# Patient Record
Sex: Male | Born: 1995 | Race: White | Hispanic: No | Marital: Single | Smoking: Never smoker
Health system: Southern US, Community
[De-identification: ages and names within clinical notes are randomized; demographics above are authoritative.]

---
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
## 2016-05-27 ENCOUNTER — Encounter (HOSPITAL_BASED_OUTPATIENT_CLINIC_OR_DEPARTMENT_OTHER): Payer: Self-pay | Admitting: *Deleted

## 2016-05-27 ENCOUNTER — Emergency Department (HOSPITAL_BASED_OUTPATIENT_CLINIC_OR_DEPARTMENT_OTHER): Payer: Self-pay

## 2016-05-27 ENCOUNTER — Emergency Department (HOSPITAL_BASED_OUTPATIENT_CLINIC_OR_DEPARTMENT_OTHER)
Admission: EM | Admit: 2016-05-27 | Discharge: 2016-05-27 | Disposition: A | Discharge status: Home / Self Care | Payer: Self-pay | Attending: Emergency Medicine | Admitting: Emergency Medicine

## 2016-05-27 DIAGNOSIS — S62366A Nondisplaced fracture of neck of fifth metacarpal bone, right hand, initial encounter for closed fracture: Secondary | ICD-10-CM | POA: Insufficient documentation

## 2016-05-27 DIAGNOSIS — Y939 Activity, unspecified: Secondary | ICD-10-CM | POA: Insufficient documentation

## 2016-05-27 DIAGNOSIS — S62309A Unspecified fracture of unspecified metacarpal bone, initial encounter for closed fracture: Secondary | ICD-10-CM

## 2016-05-27 DIAGNOSIS — Y999 Unspecified external cause status: Secondary | ICD-10-CM | POA: Insufficient documentation

## 2016-05-27 DIAGNOSIS — W2201XA Walked into wall, initial encounter: Secondary | ICD-10-CM | POA: Insufficient documentation

## 2016-05-27 DIAGNOSIS — Y929 Unspecified place or not applicable: Secondary | ICD-10-CM | POA: Insufficient documentation

## 2016-05-27 NOTE — ED Provider Notes (Signed)
[facility] Provider Note   CSN: [id number] Arrival date & time: 05/27/16  1455  First Provider Contact:  None    By signing my name below, I, Majel Homer, attest that this documentation has been prepared under the direction and in the presence of Rolland Porter, MD . Electronically Signed: Majel Homer, Scribe. 05/27/2016. 4:18 PM.  History   Chief Complaint Chief Complaint  Patient presents with  . Hand Injury   The history is provided by the patient. No language interpreter was used.   HPI Comments: Sean Chandler is a 20 y.o. right hand dominant male who presents to the Emergency Department complaining of gradually worsening, right hand pain that began last night. Pt reports he was drinking with his friends last night when he participated in a "who can hit the wall the hardest" contest.   History reviewed. No pertinent past medical history.  There are no active problems to display for this patient.   History reviewed. No pertinent surgical history.  Home Medications    Prior to Admission medications   Not on File    Family History No family history on file.  Social History Social History  Substance Use Topics  . Smoking status: Never Smoker  . Smokeless tobacco: Never Used  . Alcohol use Yes   Allergies   Review of patient's allergies indicates no known allergies.  Review of Systems Review of Systems  Constitutional: Negative for appetite change, chills, diaphoresis, fatigue and fever.  HENT: Negative for mouth sores, sore throat and trouble swallowing.   Eyes: Negative for visual disturbance.  Respiratory: Negative for cough, chest tightness, shortness of breath and wheezing.   Cardiovascular: Negative for chest pain.  Gastrointestinal: Negative for abdominal distention, abdominal pain, diarrhea, nausea and vomiting.  Endocrine: Negative for polydipsia, polyphagia and polyuria.  Genitourinary: Negative for dysuria, frequency and hematuria.    Musculoskeletal: Positive for arthralgias (right hand ). Negative for gait problem.  Skin: Negative for color change, pallor and rash.  Neurological: Negative for dizziness, syncope, light-headedness and headaches.  Hematological: Does not bruise/bleed easily.  Psychiatric/Behavioral: Negative for behavioral problems and confusion.   Physical Exam Updated Vital Signs BP 140/92   Pulse 78   Temp 98.3 F (36.8 C) (Oral)   Resp 18   Ht  (1.803 m)   Wt 145 lb (65.8 kg)   SpO2 98%   BMI 20.22 kg/m   Physical Exam  Constitutional: He is oriented to person, place, and time. He appears well-developed and well-nourished. No distress.  HENT:  Head: Normocephalic.  Eyes: Conjunctivae are normal. Pupils are equal, round, and reactive to light. No scleral icterus.  Neck: Normal range of motion. Neck supple. No thyromegaly present.  Cardiovascular: Normal rate and regular rhythm.  Exam reveals no gallop and no friction rub.   No murmur heard. Pulmonary/Chest: Effort normal and breath sounds normal. No respiratory distress. He has no wheezes. He has no rales.  Abdominal: Soft. Bowel sounds are normal. He exhibits no distension. There is no tenderness. There is no rebound.  Musculoskeletal: Normal range of motion.  Tenderness in minimal soft tissue; swelling at right fifth proximal metacarpal without deformity  Normal NVA exam   Neurological: He is alert and oriented to person, place, and time.  Skin: Skin is warm and dry. No rash noted.  Psychiatric: He has a normal mood and affect. His behavior is normal.   ED Treatments / Results  Labs (all labs ordered are listed, but only abnormal  results are displayed) Labs Reviewed - No data to display  EKG  EKG Interpretation None       Radiology No results found. Procedures Procedures  DIAGNOSTIC STUDIES:  Oxygen Saturation is 98% on RA, normal by my interpretation.    COORDINATION OF CARE:  4:16 PM Discussed treatment plan  with pt at bedside and pt agreed to plan.   4:05 PM X-ray shows fracture, right hand was placed in splint   SPLINT APPLICATION Date/Time: 9:11 AM Authorized by: Claudean Kinds Consent: Verbal consent obtained. Risks and benefits: risks, benefits and alternatives were discussed Consent given by: patient Splint applied by: orthopedic technician Location details: Hand Splint type:  Supplies used: ortho glass Post-procedure: The splinted body part was neurovascularly unchanged following the procedure. Patient tolerance: Patient tolerated the procedure well with no immediate complications.       Medications Ordered in ED Medications - No data to display  Initial Impression / Assessment and Plan / ED Course  I have reviewed the triage vital signs and the nursing notes.  Pertinent labs & imaging results that were available during my care of the patient were reviewed by me and considered in my medical decision making (see chart for details).  Clinical Course      Final Clinical Impressions(s) / ED Diagnoses   Final diagnoses:  Metacarpal bone fracture, closed, initial encounter    New Prescriptions There are no discharge medications for this patient.  I personally performed the services described in this documentation, which was scribed in my presence. The recorded information has been reviewed and is accurate.     Rolland Porter, MD 06/05/16 [phone]

## 2016-05-27 NOTE — ED Triage Notes (Signed)
He hit a wall with his right fist last night. Pain, bruise and swelling today.

## 2017-03-20 IMAGING — CR DG HAND COMPLETE 3+V*R*
3 series · 3 of 3 positions shown · non-contrast
Comparison: None.

CLINICAL DATA: Patient hit wall with pain

EXAM:
RIGHT HAND - COMPLETE 3+ VIEW

[x hand pa right]
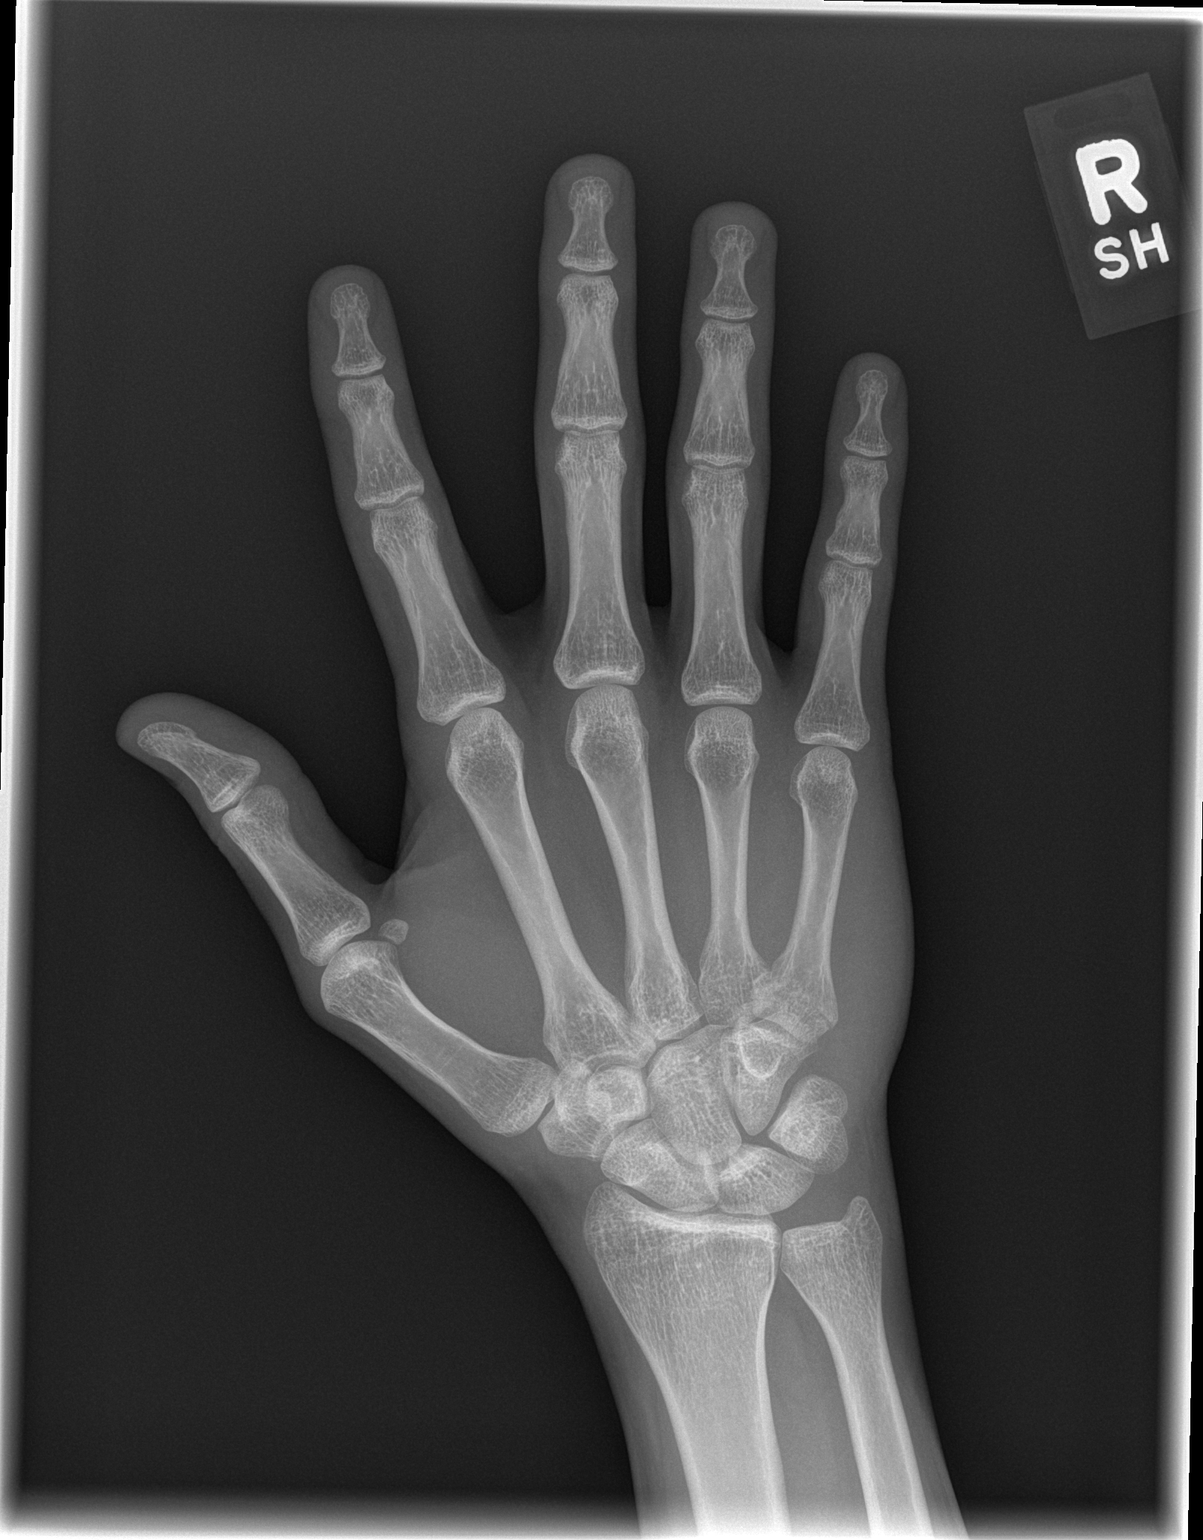

[x hand oblique right]
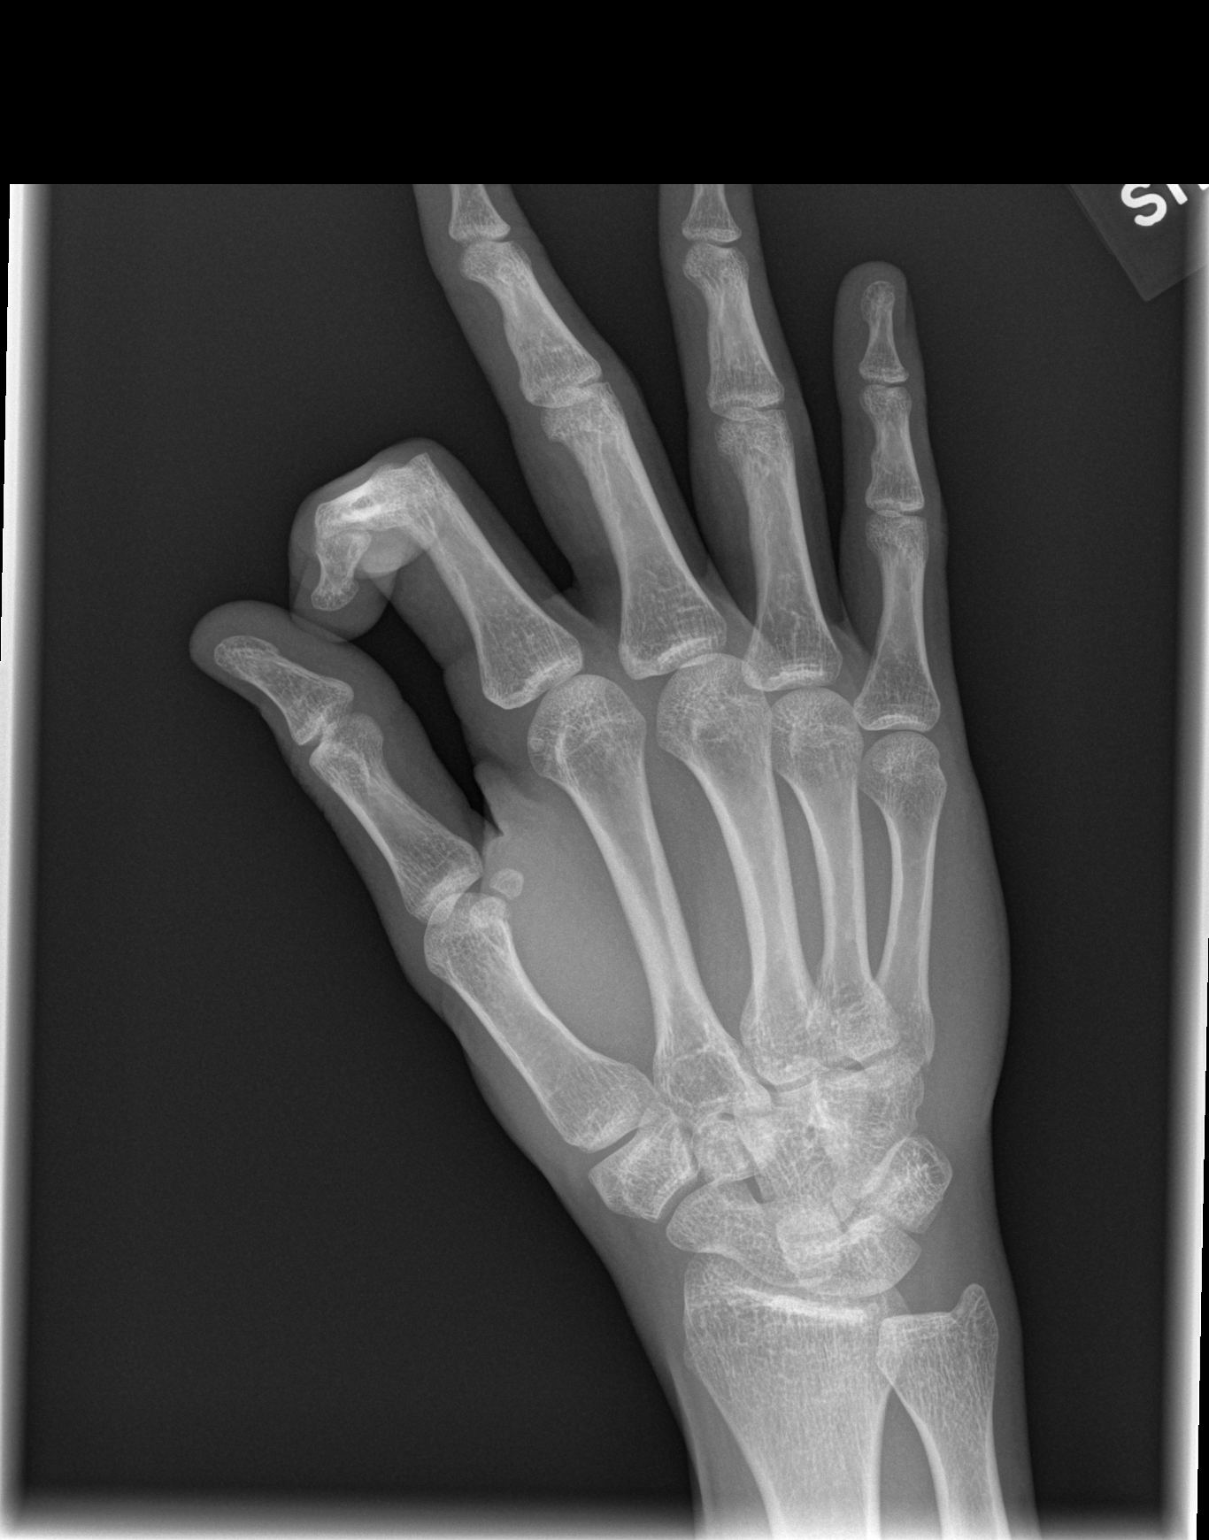

[x hand lat right]
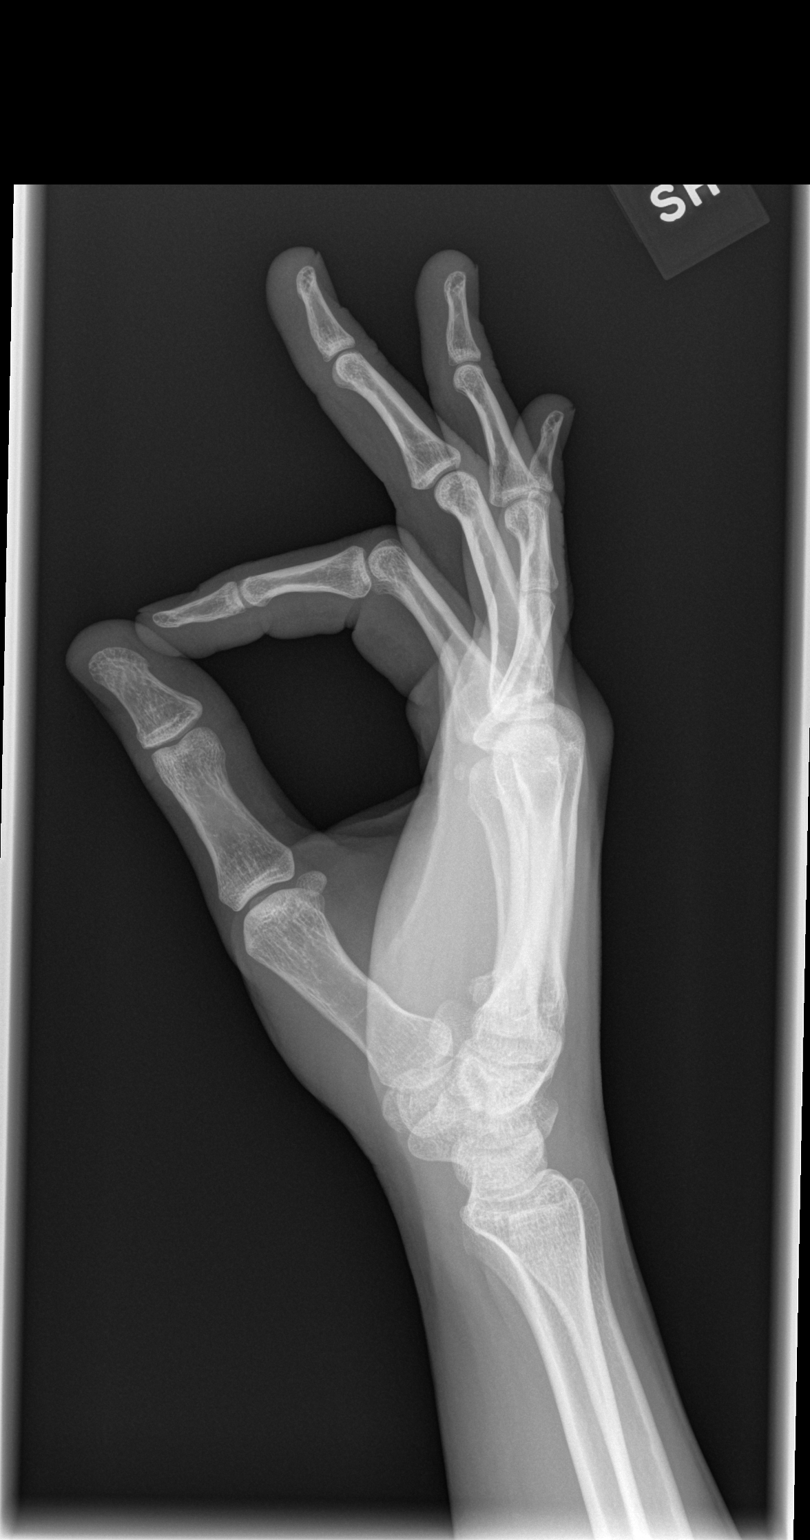

[3 of 3 positions shown; findings below may reference images not displayed]

FINDINGS: Frontal, oblique, and lateral views were obtained. There is a
nondisplaced fracture of the proximal fifth metacarpal. There is
soft tissue swelling in this area medially. No other fracture is
evident. No dislocation. The joint spaces appear normal. No erosive
change.
IMPRESSION: Fracture of the proximal aspect of the fifth metacarpal with
alignment essentially anatomic. No other fracture no dislocation.
Soft tissue swelling medially. No apparent arthropathy.

## 2023-06-21 ENCOUNTER — Emergency Department (HOSPITAL_BASED_OUTPATIENT_CLINIC_OR_DEPARTMENT_OTHER)
Admission: EM | Admit: 2023-06-21 | Discharge: 2023-06-21 | Disposition: A | Discharge status: Home / Self Care | Payer: Self-pay | Attending: Emergency Medicine | Admitting: Emergency Medicine

## 2023-06-21 ENCOUNTER — Other Ambulatory Visit: Payer: Self-pay

## 2023-06-21 ENCOUNTER — Encounter (HOSPITAL_BASED_OUTPATIENT_CLINIC_OR_DEPARTMENT_OTHER): Payer: Self-pay | Admitting: Emergency Medicine

## 2023-06-21 DIAGNOSIS — R070 Pain in throat: Secondary | ICD-10-CM | POA: Insufficient documentation

## 2023-06-21 LAB — CBC WITH DIFFERENTIAL/PLATELET
Abs Immature Granulocytes: 0.03 10*3/uL (ref 0.00–0.07)
Basophils Absolute: 0.1 10*3/uL (ref 0.0–0.1)
Basophils Relative: 1 %
Eosinophils Absolute: 0.1 10*3/uL (ref 0.0–0.5)
Eosinophils Relative: 1 %
HCT: 46.6 % (ref 39.0–52.0)
Hemoglobin: 16.2 g/dL (ref 13.0–17.0)
Immature Granulocytes: 0 %
Lymphocytes Relative: 14 %
Lymphs Abs: 1.4 10*3/uL (ref 0.7–4.0)
MCH: 31 pg (ref 26.0–34.0)
MCHC: 34.8 g/dL (ref 30.0–36.0)
MCV: 89.3 fL (ref 80.0–100.0)
Monocytes Absolute: 0.7 10*3/uL (ref 0.1–1.0)
Monocytes Relative: 7 %
Neutro Abs: 7.9 10*3/uL — ABNORMAL HIGH (ref 1.7–7.7)
Neutrophils Relative %: 77 %
Platelets: 243 10*3/uL (ref 150–400)
RBC: 5.22 MIL/uL (ref 4.22–5.81)
RDW: 12.4 % (ref 11.5–15.5)
WBC: 10.2 10*3/uL (ref 4.0–10.5)
nRBC: 0 % (ref 0.0–0.2)

## 2023-06-21 LAB — COMPREHENSIVE METABOLIC PANEL
ALT: 65 U/L — ABNORMAL HIGH (ref 0–44)
AST: 32 U/L (ref 15–41)
Albumin: 4.9 g/dL (ref 3.5–5.0)
Alkaline Phosphatase: 85 U/L (ref 38–126)
Anion gap: 11 (ref 5–15)
BUN: 12 mg/dL (ref 6–20)
CO2: 22 mmol/L (ref 22–32)
Calcium: 9.4 mg/dL (ref 8.9–10.3)
Chloride: 104 mmol/L (ref 98–111)
Creatinine, Ser: 0.89 mg/dL (ref 0.61–1.24)
GFR, Estimated: >60 mL/min (ref >60)
Glucose, Bld: 92 mg/dL (ref 70–99)
Potassium: 3.8 mmol/L (ref 3.5–5.1)
Sodium: 137 mmol/L (ref 135–145)
Total Bilirubin: 1.2 mg/dL (ref 0.3–1.2)
Total Protein: 7 g/dL (ref 6.5–8.1)

## 2023-06-21 NOTE — Discharge Instructions (Signed)
You came to the emergency department for concerns of a possible parasitic infection with symptoms of "worms crawling in the throat and sinuses."  We took some blood work including a complete blood count and a complete metabolic panel, which did not show any signs of active parasitic infection.   As I discussed with you, if the symptoms you have been experiencing continue to worsen, or if you experience signs of an active infection including fevers, chills, night sweats, vomiting, or diarrhea, you should return to the emergency department.

## 2023-06-21 NOTE — ED Triage Notes (Signed)
Pt was in [place], back about a week. While on cruise, pt ate some fruit (local to the area). Today started with feeling like something crawling in throat and sinuses. Pt did cough something that looked like a parasite.

## 2023-06-21 NOTE — ED Provider Notes (Signed)
I saw and evaluated the patient, reviewed the resident's note and I agree with the findings and plan.   27 year old male presents with sensation of worms in his throat.  Came back from [place] recently and ate some food and thinks may have contracted a parasitic infection.  On exam, his oropharynx is normal.  Will check CBC and likely referral to infectious disease   Lorre Nick, MD 06/21/23 1554

## 2023-06-21 NOTE — ED Provider Notes (Signed)
[place] EMERGENCY DEPARTMENT AT [facility] Provider Note   CSN: [id number] Arrival date & time: 06/21/23  1332     History Feeling like something crawling in throat and sinuses, coughed up something that looked like a worm  Sean Chandler is a 27 y.o. male with no significant past medical history who presents with throat scratchiness and the sensation that bugs are crawling in his throat and sinuses.  Mom is bedside.  He says that a week ago he returned from a cruise in the [place].  He was feeling a little tired, intermittently nauseous, but no other symptoms over the past week.  This morning he woke up with a scratchy sensation in his throat and sensation of " bugs crawling in his sinuses and throat."  He says that he coughed up a small worm this morning, but it was broken up and he did not save it.  Since then, he has had a persistent sensation of worms crawling in his sinuses and throat.  At home he tried soda and a decongestant for his symptoms but did not have any improvement.  He does note that he ate some local unwashed fruit during his trip.  He has never had any sensations like this before.  He denies any fevers, chills, diarrhea, chest pain, abdominal pain, vomiting in the past week. He does feel fatigued but attributes it to his recent travel.   Home Medications Prior to Admission medications   Not on File      Allergies    Patient has no known allergies.    Review of Systems   Review of Systems  Constitutional:  Positive for fatigue. Negative for chills and fever.  HENT:         Sensation of bugs crawling in his sinuses  Eyes: Negative.   Respiratory:  Negative for choking, shortness of breath and wheezing.   Cardiovascular: Negative.   Gastrointestinal:  Positive for nausea. Negative for abdominal pain, diarrhea and vomiting.  Endocrine: Negative.   Genitourinary: Negative.   Musculoskeletal: Negative.   Skin: Negative.   Allergic/Immunologic: Negative.    Neurological: Negative.   Hematological: Negative.   Psychiatric/Behavioral:  The patient is nervous/anxious.    Physical Exam Updated Vital Signs BP (!) 142/83 (BP Location: Right Arm)   Pulse 88   Temp 98.7 F (37.1 C) (Oral)   Resp 18   SpO2 100%  Physical Exam Constitutional:      Appearance: Normal appearance.  HENT:     Head: Normocephalic and atraumatic.     Mouth/Throat:     Mouth: Mucous membranes are moist.     Pharynx: Posterior oropharyngeal erythema present. No oropharyngeal exudate.     Comments: Mild erythema in soft palate Eyes:     Extraocular Movements: Extraocular movements intact.     Pupils: Pupils are equal, round, and reactive to light.  Cardiovascular:     Rate and Rhythm: Normal rate and regular rhythm.     Pulses: Normal pulses.     Heart sounds: Normal heart sounds.  Pulmonary:     Effort: Pulmonary effort is normal.     Breath sounds: Normal breath sounds.  Abdominal:     General: There is no distension.     Palpations: Abdomen is soft.     Tenderness: There is no abdominal tenderness. There is no guarding.  Musculoskeletal:        General: Normal range of motion.  Skin:    General: Skin is warm.  Findings: No rash.  Neurological:     General: No focal deficit present.     Mental Status: He is alert and oriented to person, place, and time.     ED Results / Procedures / Treatments   Labs (all labs ordered are listed, but only abnormal results are displayed) Labs Reviewed  CBC WITH DIFFERENTIAL/PLATELET - Abnormal; Notable for the following components:      Result Value   Neutro Abs 7.9 (*)    All other components within normal limits  COMPREHENSIVE METABOLIC PANEL - Abnormal; Notable for the following components:   ALT 65 (*)    All other components within normal limits   Medications Ordered in ED Medications - No data to display  ED Course/ Medical Decision Making/ A&P                               Medical Decision  Making Amount and/or Complexity of Data Reviewed Labs: ordered.   Shahrukh Horrocks is a 27 year old male with no significant PMH who presented with a sensation of worms crawling in his sinuses and throat following a cruise in the [place].  Differential included parasite ingestion vs delusional parasitosis vs anxiety.  Heart rate initially elevated at 104.  Blood pressure elevated at 158/94.  Afebrile, satting well on room air.  Patient is a little anxious about the possibility of having a parasite.  His physical exam is unremarkable. Repeat HR normalized to 88, BP better but still elevated at 142/83.   We will order a CBC and CMP.  If those are unconcerning, I will provide reassurance and discharge home.  If there is any sign of possible parasitic infection, I will schedule follow-up appointment with infectious diseases.  CBC with differential was unremarkable.  No leukocytosis.  Eosinophils 1%. CMP showed a mildly elevated ALT at 65, but was otherwise unremarkable.  Given the above findings, we have low suspicion for an active parasitic infection at this time and believe the patient is medically stable for discharge. If the symptoms continue to worsen, or the patient experiences signs of an active infection including fevers, chills, night sweats, vomiting, diarrhea, he should return to the emergency department.    Annett Fabian, MD 06/21/23 1717    Lorre Nick, MD 06/22/23 1740
